# Patient Record
Sex: Female | Born: 1960 | Race: White | Hispanic: No | Marital: Married | Smoking: Never smoker
Health system: Southern US, Community
[De-identification: ages and names within clinical notes are randomized; demographics above are authoritative.]

---

## 2005-08-24 ENCOUNTER — Emergency Department (HOSPITAL_COMMUNITY): Admission: EM | Admit: 2005-08-24 | Discharge: 2005-08-25 | Payer: Self-pay | Admitting: *Deleted

## 2005-08-25 ENCOUNTER — Ambulatory Visit (HOSPITAL_COMMUNITY): Admission: RE | Admit: 2005-08-25 | Discharge: 2005-08-25 | Payer: Self-pay | Admitting: Emergency Medicine

## 2005-08-25 ENCOUNTER — Encounter: Payer: Self-pay | Admitting: Vascular Surgery

## 2010-03-31 ENCOUNTER — Encounter (INDEPENDENT_AMBULATORY_CARE_PROVIDER_SITE_OTHER): Payer: Self-pay | Admitting: *Deleted

## 2010-04-01 ENCOUNTER — Encounter (INDEPENDENT_AMBULATORY_CARE_PROVIDER_SITE_OTHER): Payer: Self-pay | Admitting: *Deleted

## 2010-04-02 ENCOUNTER — Ambulatory Visit: Payer: Self-pay | Admitting: Internal Medicine

## 2010-04-14 ENCOUNTER — Ambulatory Visit: Payer: Self-pay | Admitting: Internal Medicine

## 2010-06-01 NOTE — Letter (Signed)
Summary: [facility] Instructions  [facility]  [address] [place], [place] [address]   Phone: [phone]  Fax: [phone]       ALECIA DOI    July 07, 1960    MRN: [id number]       Procedure Day Dorna Bloom:  Wednesday 04/14/2010     Arrival Time: 8:30 am     Procedure Time: 9:30 am     Location of Procedure:                    _ x_  [facility] (4th Floor)   PREPARATION FOR COLONOSCOPY WITH MIRALAX  Starting 5 days prior to your procedure Thursday 12/9 do not eat nuts, seeds, popcorn, corn, beans, peas,  salads, or any raw vegetables.  Do not take any fiber supplements (e.g. Metamucil, Citrucel, and Benefiber). ____________________________________________________________________________________________________   THE DAY BEFORE YOUR PROCEDURE         DATE: Tuesday 12/13  1   Drink clear liquids the entire day-NO SOLID FOOD  2   Do not drink anything colored red or purple.  Avoid juices with pulp.  No orange juice.  3   Drink at least 64 oz. (8 glasses) of fluid/clear liquids during the day to prevent dehydration and help the prep work efficiently.  CLEAR LIQUIDS INCLUDE: Water Jello Ice Popsicles Tea (sugar ok, no milk/cream) Powdered fruit flavored drinks Coffee (sugar ok, no milk/cream) Gatorade Juice: apple, white grape, white cranberry  Lemonade Clear bullion, consomm, broth Carbonated beverages (any kind) Strained chicken noodle soup Hard Candy  4   Mix the entire bottle of Miralax with 64 oz. of Gatorade/Powerade in the morning and put in the refrigerator to chill.  5   At 3:00 pm take 2 Dulcolax/Bisacodyl tablets.  6   At 4:30 pm take one Reglan/Metoclopramide tablet.  7  Starting at 5:00 pm drink one 8 oz glass of the Miralax mixture every 15-20 minutes until you have finished drinking the entire 64 oz.  You should finish drinking prep around 7:30 or 8:00 pm.  8   If you are nauseated, you may take the 2nd Reglan/Metoclopramide  tablet at 6:30 pm.        9    At 8:00 pm take 2 more DULCOLAX/Bisacodyl tablets.     THE DAY OF YOUR PROCEDURE      DATE:  Wednesday 12/14  You may drink clear liquids until 7:30 am  (2 HOURS BEFORE PROCEDURE).   MEDICATION INSTRUCTIONS  Unless otherwise instructed, you should take regular prescription medications with a small sip of water as early as possible the morning of your procedure.         OTHER INSTRUCTIONS  You will need a responsible adult at least 51 years of age to accompany you and drive you home.   This person must remain in the waiting room during your procedure.  Wear loose fitting clothing that is easily removed.  Leave jewelry and other valuables at home.  However, you may wish to bring a book to read or an iPod/MP3 player to listen to music as you wait for your procedure to start.  Remove all body piercing jewelry and leave at home.  Total time from sign-in until discharge is approximately 2-3 hours.  You should go home directly after your procedure and rest.  You can resume normal activities the day after your procedure.  The day of your procedure you should not:   Drive   Make legal  decisions   Operate machinery   Drink alcohol   Return to work  You will receive specific instructions about eating, activities and medications before you leave.   The above instructions have been reviewed and explained to me by   Karl Bales RN  April 02, 2010 4:07 PM    I fully understand and can verbalize these instructions _____________________________ Date _______

## 2010-06-01 NOTE — Miscellaneous (Signed)
Summary: [facility] previsit  Clinical Lists Changes  Medications: Added new medication of DULCOLAX 5 MG  TBEC (BISACODYL) Day before procedure take 2 at 3pm and 2 at 8pm. - Signed Added new medication of METOCLOPRAMIDE HCL 10 MG  TABS (METOCLOPRAMIDE HCL) As per prep instructions. - Signed Added new medication of MIRALAX   POWD (POLYETHYLENE GLYCOL 3350) As per prep  instructions. - Signed Rx of DULCOLAX 5 MG  TBEC (BISACODYL) Day before procedure take 2 at 3pm and 2 at 8pm.;  #4 x 0;  Signed;  Entered by: Karl Bales RN;  Authorized by: Hart Carwin MD;  Method used: [facility] to [facility] [phone]*, [address] [place], [place], [place]  [address], Ph: [phone], Fax: [phone] Rx of METOCLOPRAMIDE HCL 10 MG  TABS (METOCLOPRAMIDE HCL) As per prep instructions.;  #2 x 0;  Signed;  Entered by: Karl Bales RN;  Authorized by: Hart Carwin MD;  Method used: Electronically to [facility] [phone]*, [address] [place], [place], [place]  [address], Ph: [phone], Fax: [phone] Rx of MIRALAX   POWD (POLYETHYLENE GLYCOL 3350) As per prep  instructions.;  #255gm x 0;  Signed;  Entered by: Karl Bales RN;  Authorized by: Hart Carwin MD;  Method used: Electronically to [facility] [phone]*, [address] [place], [place], [place]  [address], Ph: [phone], Fax: [phone] Allergies: Added new allergy or adverse reaction of CODEINE Added new allergy or adverse reaction of DEMEROL Observations: Added new observation of NKA: F (04/02/2010 15:40)    Prescriptions: MIRALAX   POWD (POLYETHYLENE GLYCOL 3350) As per prep  instructions.  #255gm x 0   Entered by:   Karl Bales RN   Authorized by:   Hart Carwin MD   Signed by:   Karl Bales RN on 04/02/2010   Method used:   Electronically to        [facility] [phone]* (retail)       [address]       [place], [place]   [address]       Ph: [phone]       Fax: [phone]   RxID:   [id number] METOCLOPRAMIDE HCL 10 MG  TABS (METOCLOPRAMIDE HCL) As per prep instructions.  #2 x 0   Entered by:   Karl Bales RN   Authorized by:   Hart Carwin MD   Signed by:   Karl Bales RN on 04/02/2010   Method used:   Electronically to        [facility] [phone]* (retail)       [address]       [place], [place]  [address]       Ph: [phone]       Fax: [phone]   RxID:   [id number] DULCOLAX 5 MG  TBEC (BISACODYL) Day before procedure take 2 at 3pm and 2 at 8pm.  #4 x 0   Entered by:   Karl Bales RN   Authorized by:   Hart Carwin MD   Signed by:   Karl Bales RN on 04/02/2010   Method used:   Electronically to        [facility] [phone]* (retail)       [address] [facility]       [place],  [place]  [address]       Ph: [phone]       Fax: [phone]   RxID:   [id number]

## 2010-06-01 NOTE — Letter (Signed)
Summary: Pre Visit Letter Revised  [facility]  [address] [place], [place] [address]   Phone: [phone]  Fax: [phone]        03/31/2010 MRN: [id number] Regina Leblanc [address] [place], [place]  [address]             Procedure Date:  04-15-11   Welcome to the Gastroenterology Division at [facility].    You are scheduled to see a nurse for your pre-procedure visit on 04-02-10 at 4:00p.m. on the 3rd floor at [facility], [address]  We ask that you try to arrive at our office 15 minutes prior to your appointment time to allow for check-in.  Please take a minute to review the attached form.  If you answer "Yes" to one or more of the questions on the first page, we ask that you call the person listed at your earliest opportunity.  If you answer "No" to all of the questions, please complete the rest of the form and bring it to your appointment.    Your nurse visit will consist of discussing your medical and surgical history, your immediate family medical history, and your medications.   If you are unable to list all of your medications on the form, please bring the medication bottles to your appointment and we will list them.  We will need to be aware of both prescribed and over the counter drugs.  We will need to know exact dosage information as well.    Please be prepared to read and sign documents such as consent forms, a financial agreement, and acknowledgement forms.  If necessary, and with your consent, a friend or relative is welcome to sit-in on the nurse visit with you.  Please bring your insurance card so that we may make a copy of it.  If your insurance requires a referral to see a specialist, please bring your referral form from your primary care physician.  No co-pay is required for this nurse visit.     If you cannot keep your appointment, please call [phone] to cancel or reschedule prior to your appointment date.  This allows  [place] the opportunity to schedule an appointment for another patient in need of care.    Thank you for choosing [facility] for your medical needs.  We appreciate the opportunity to care for you.  Please visit [place] at our website  to learn more about our practice.  Sincerely, The Gastroenterology Division

## 2010-06-03 NOTE — Procedures (Signed)
Summary: Colonoscopy  Patient: Evalette Montrose Note: All result statuses are Final unless otherwise noted.  Tests: (1) Colonoscopy (COL)   COL Colonoscopy           DONE     [facility]     [address]     [place], [place]  [address]           COLONOSCOPY PROCEDURE REPORT           PATIENT:  Lai, Hendriks  MR#:  [id number]     BIRTHDATE:  Aug 25, 1960, 49 yrs. old  GENDER:  female     ENDOSCOPIST:  Hedwig Morton. Juanda Chance, MD     REF. BY:  Annye Rusk, M.D.     PROCEDURE DATE:  04/14/2010     PROCEDURE:  Colonoscopy 44010     ASA CLASS:  Class I     INDICATIONS:  mother with colon cancer     MEDICATIONS:   Versed 10 mg, Fentanyl 100 mcg           DESCRIPTION OF PROCEDURE:   After the risks benefits and     alternatives of the procedure were thoroughly explained, informed     consent was obtained.  Digital rectal exam was performed and     revealed no rectal masses.   The [id number] endoscope was     introduced through the anus and advanced to the cecum, which was     identified by both the appendix and ileocecal valve, without     limitations.  The quality of the prep was good, using MiraLax.     The instrument was then slowly withdrawn as the colon was fully     examined.     <<PROCEDUREIMAGES>>           FINDINGS:  no active bleeding or blood in c (see image1, image2,     image3, image4, image5, and image6).   Retroflexed views in the     rectum revealed no abnormalities.    The scope was then withdrawn     from the patient and the procedure completed.           COMPLICATIONS:  None     ENDOSCOPIC IMPRESSION:     1) No active bleeding or blood in c     RECOMMENDATIONS:     1) high fiber diet     REPEAT EXAM:  In 5 year(s) for.           ______________________________     Hedwig Morton. Juanda Chance, MD           CC:           n.     eSIGNED:   Hedwig Morton. Brodie at 04/14/2010 10:14 AM           Sylvan Cheese, [phone]  Note: An exclamation mark (!) indicates a result  that was not dispersed into the flowsheet. Document Creation Date: 04/14/2010 10:14 AM _______________________________________________________________________  (1) Order result status: Final Collection or observation date-time: 04/14/2010 09:58 Requested date-time:  Receipt date-time:  Reported date-time:  Referring Physician:   Ordering Physician: Lina Sar [id number]) Specimen Source:  Source: Launa Grill Order Number: [id number] Lab site:   Appended Document: Colonoscopy    Clinical Lists Changes  Observations: Added new observation of COLONNXTDUE: 04/2015 (04/14/2010 11:02)

## 2015-05-28 ENCOUNTER — Encounter: Payer: Self-pay | Admitting: Gastroenterology

## 2020-08-13 ENCOUNTER — Encounter (HOSPITAL_BASED_OUTPATIENT_CLINIC_OR_DEPARTMENT_OTHER): Payer: Self-pay | Admitting: Emergency Medicine

## 2020-08-13 ENCOUNTER — Other Ambulatory Visit: Payer: Self-pay

## 2020-08-13 ENCOUNTER — Emergency Department (HOSPITAL_BASED_OUTPATIENT_CLINIC_OR_DEPARTMENT_OTHER): Payer: 59

## 2020-08-13 ENCOUNTER — Emergency Department (HOSPITAL_BASED_OUTPATIENT_CLINIC_OR_DEPARTMENT_OTHER)
Admission: EM | Admit: 2020-08-13 | Discharge: 2020-08-13 | Disposition: A | Discharge status: Home / Self Care | Payer: 59 | Attending: Emergency Medicine | Admitting: Emergency Medicine

## 2020-08-13 DIAGNOSIS — W208XXA Other cause of strike by thrown, projected or falling object, initial encounter: Secondary | ICD-10-CM | POA: Diagnosis not present

## 2020-08-13 DIAGNOSIS — S060X0A Concussion without loss of consciousness, initial encounter: Secondary | ICD-10-CM | POA: Insufficient documentation

## 2020-08-13 DIAGNOSIS — Y9389 Activity, other specified: Secondary | ICD-10-CM | POA: Diagnosis not present

## 2020-08-13 DIAGNOSIS — S0083XA Contusion of other part of head, initial encounter: Secondary | ICD-10-CM | POA: Diagnosis not present

## 2020-08-13 DIAGNOSIS — S0990XA Unspecified injury of head, initial encounter: Secondary | ICD-10-CM | POA: Diagnosis present

## 2020-08-13 NOTE — ED Provider Notes (Signed)
[facility] EMERGENCY DEPT Provider Note   CSN: [id number] Arrival date & time: 08/13/20  1532     History Chief Complaint  Patient presents with  . Head Injury    Regina Leblanc is a 60 y.o. female.  60 yo F with a chief complaints of a cable box that hit her in the head.  She was on the ground trying to plug-in something in the box fell off of a table that is about waist high and fell and struck her in the back of the head.  She has had a mild headache and a bump to the head.  She has had increased sleepiness today and has had some unsteadiness to her gait and so decided to come in for evaluation.  Denies one-sided numbness or weakness denies vomiting denies confusion.  The history is provided by the patient.  Head Injury Location:  Occipital Time since incident:  1 day Mechanism of injury: direct blow   Pain details:    Quality:  Aching   Severity:  Mild   Duration:  1 day   Timing:  Constant   Progression:  Worsening Chronicity:  New Relieved by:  Nothing Worsened by:  Nothing Ineffective treatments:  None tried Associated symptoms: headache   Associated symptoms: no nausea and no vomiting        History reviewed. No pertinent past medical history.  There are no problems to display for this patient.   History reviewed. No pertinent surgical history.   OB History   No obstetric history on file.     No family history on file.  Social History   Tobacco Use  . Smoking status: Never Smoker  . Smokeless tobacco: Never Used    Home Medications Prior to Admission medications   Not on File    Allergies    Codeine and Meperidine hcl  Review of Systems   Review of Systems  Constitutional: Negative for chills and fever.  HENT: Negative for congestion and rhinorrhea.   Eyes: Negative for redness and visual disturbance.  Respiratory: Negative for shortness of breath and wheezing.   Cardiovascular: Negative for chest pain and palpitations.   Gastrointestinal: Negative for nausea and vomiting.  Genitourinary: Negative for dysuria and urgency.  Musculoskeletal: Negative for arthralgias and myalgias.  Skin: Negative for pallor and wound.  Neurological: Positive for dizziness and headaches.    Physical Exam Updated Vital Signs BP (!) 148/81 (BP Location: Left Arm)   Pulse 87   Temp 98.8 F (37.1 C) (Oral)   Resp 16   Ht 5\' 3"  (1.6 m)   Wt 67.1 kg   SpO2 98%   BMI 26.22 kg/m   Physical Exam Vitals and nursing note reviewed.  Constitutional:      General: She is not in acute distress.    Appearance: She is well-developed. She is not diaphoretic.  HENT:     Head: Normocephalic.     Comments: Hematoma to the right side of the crown of her head.  No break to the scalp. Eyes:     Pupils: Pupils are equal, round, and reactive to light.  Cardiovascular:     Rate and Rhythm: Normal rate and regular rhythm.     Heart sounds: No murmur heard. No friction rub. No gallop.   Pulmonary:     Effort: Pulmonary effort is normal.     Breath sounds: No wheezing or rales.  Abdominal:     General: There is no distension.  Palpations: Abdomen is soft.     Tenderness: There is no abdominal tenderness.  Musculoskeletal:        General: No tenderness.     Cervical back: Normal range of motion and neck supple.  Skin:    General: Skin is warm and dry.  Neurological:     Mental Status: She is alert and oriented to person, place, and time.  Psychiatric:        Behavior: Behavior normal.     ED Results / Procedures / Treatments   Labs (all labs ordered are listed, but only abnormal results are displayed) Labs Reviewed - No data to display  EKG None  Radiology CT Head Wo Contrast  Result Date: 08/13/2020 CLINICAL DATA:  Abnormal mental status.  Status post fall EXAM: CT HEAD WITHOUT CONTRAST TECHNIQUE: Contiguous axial images were obtained from the base of the skull through the vertex without intravenous contrast.  COMPARISON:  None. FINDINGS: Brain: No evidence of large-territorial acute infarction. No parenchymal hemorrhage. No mass lesion. No extra-axial collection. No mass effect or midline shift. No hydrocephalus. Basilar cisterns are patent. Vascular: No hyperdense vessel. Skull: No acute fracture or focal lesion. Sinuses/Orbits: Mucosal thickening of bilateral sphenoid and ethmoid sinuses. Paranasal sinuses and mastoid air cells are clear. The orbits are unremarkable. Other: None. IMPRESSION: No acute intracranial abnormality. Electronically Signed   By: Tish Frederickson M.D.   On: 08/13/2020 16:48    Procedures Procedures   Medications Ordered in ED Medications - No data to display  ED Course  I have reviewed the triage vital signs and the nursing notes.  Pertinent labs & imaging results that were available during my care of the patient were reviewed by me and considered in my medical decision making (see chart for details).    MDM Rules/Calculators/A&P                          60 yo F with a chief complaints of a direct blow to her head by a cable TV box.  Seems like a low mechanism patient not on blood thinners she is however complaining of unsteadiness when she ambulates and has had increased sleepiness today.  Will obtain a CT scan of the head. CT the head is negative.  Discharge home.  4:53 PM:  I have discussed the diagnosis/risks/treatment options with the patient and believe the pt to be eligible for discharge home to follow-up with PCP. We also discussed returning to the ED immediately if new or worsening sx occur. We discussed the sx which are most concerning (e.g., sudden worsening pain, fever, inability to tolerate by mouth) that necessitate immediate return. Medications administered to the patient during their visit and any new prescriptions provided to the patient are listed below.  Medications given during this visit Medications - No data to display   The patient appears  reasonably screen and/or stabilized for discharge and I doubt any other medical condition or other [employer] requiring further screening, evaluation, or treatment in the ED at this time prior to discharge.    Final Clinical Impression(s) / ED Diagnoses Final diagnoses:  Concussion without loss of consciousness, initial encounter    Rx / DC Orders ED Discharge Orders    None       Melene Plan, DO 08/13/20 1653

## 2020-08-13 NOTE — ED Triage Notes (Signed)
She was switching the cable box to a new TV last night when it fell on the top of your head. C/o feeling "out of sorts" and a headache. Denies LOC.

## 2020-08-13 NOTE — Discharge Instructions (Signed)
Most likely you have a concussion.  You are having symptoms after a closed head injury.  Your CT scan of your head did not show any bleeding inside the skull.  Please follow-up with the family doctor.  Typically things that you your brain will make concussion symptoms worse i.e. reading using your smart phone or performing mathematics such as balancing the budget.  If you realize that something you are doing is making your symptoms worse try to stop it and do something that does not.  If this is a ongoing issue sometimes you will need to see a specialist but typically your symptoms will resolve over the course of a week.

## 2021-09-26 IMAGING — CT CT HEAD W/O CM
4 series · 17 of 47 positions shown, 19 images · non-contrast
Comparison: None.

CLINICAL DATA: Abnormal mental status.  Status post fall

EXAM:
CT HEAD WITHOUT CONTRAST
TECHNIQUE: Contiguous axial images were obtained from the base of the skull
through the vertex without intravenous contrast.

[Series 2: head wo · axial · 0.42mm/px · z∈[-179,-59]mm · 7 of 33 slices shown, 9 images]
[im 5/33  brain]
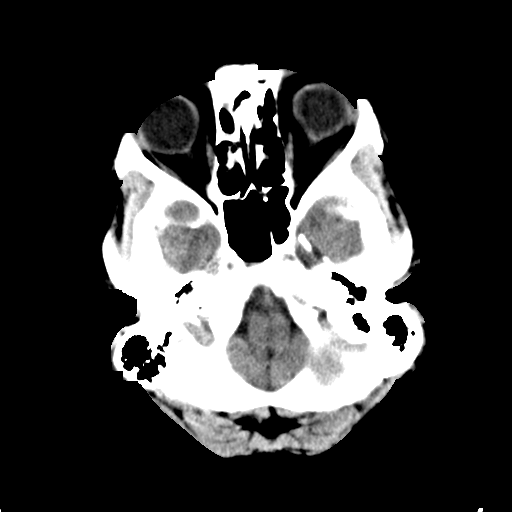
[im 5/33  bone]
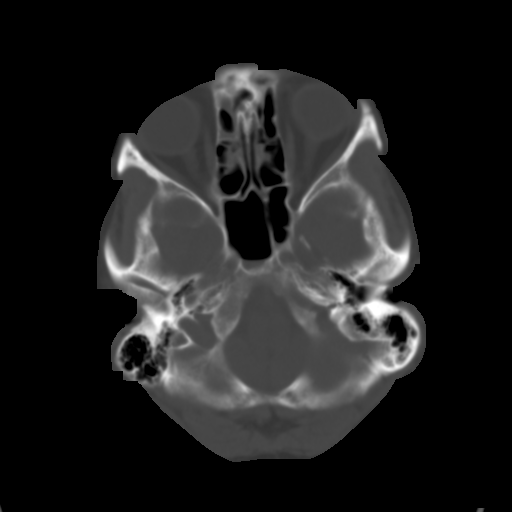
[im 9/33  brain]
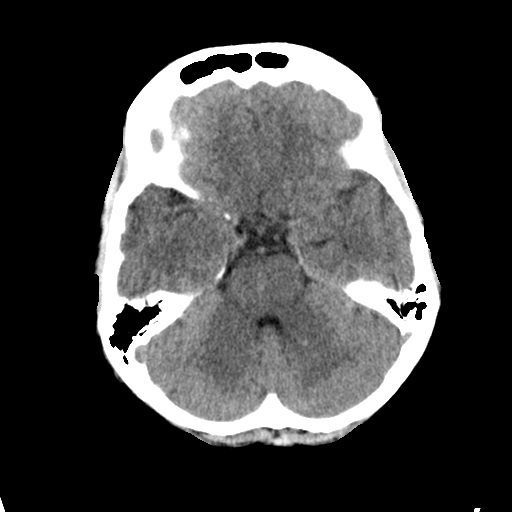
[im 13/33  brain]
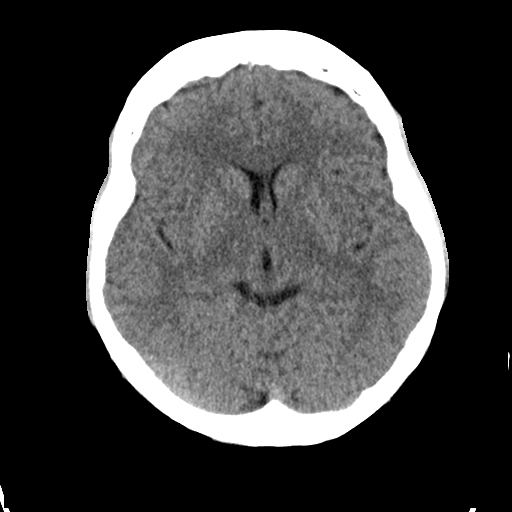
[im 17/33  brain]
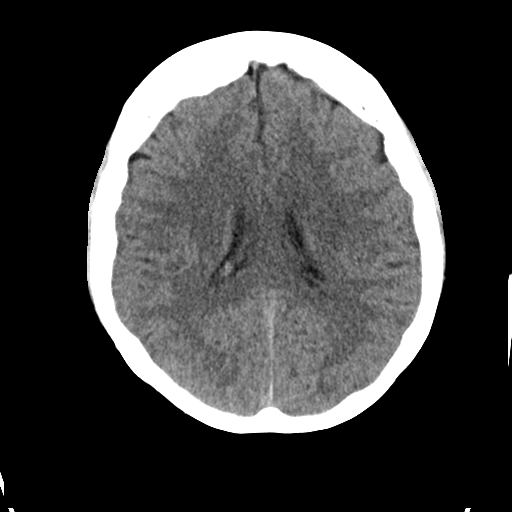
[im 21/33  brain]
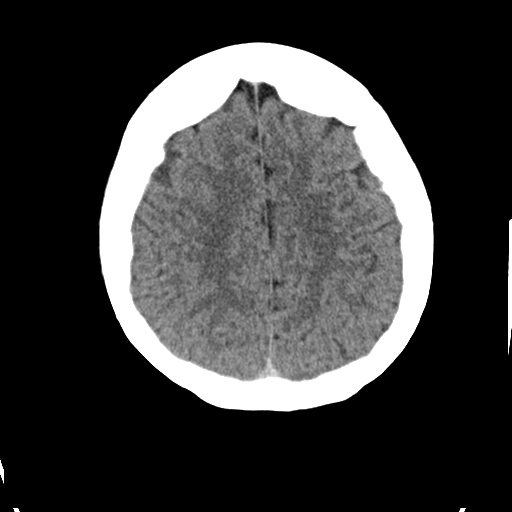
[im 21/33  bone]
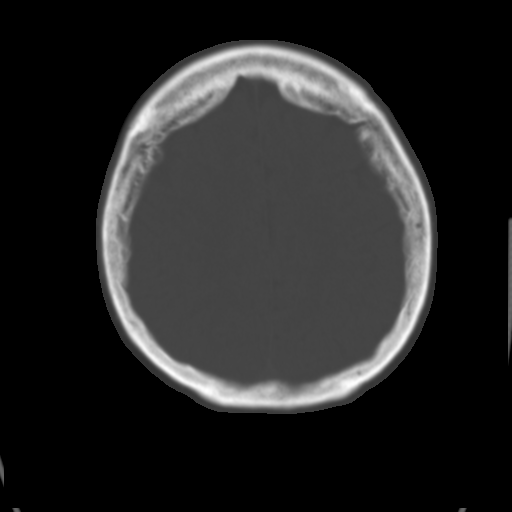
[im 25/33  brain]
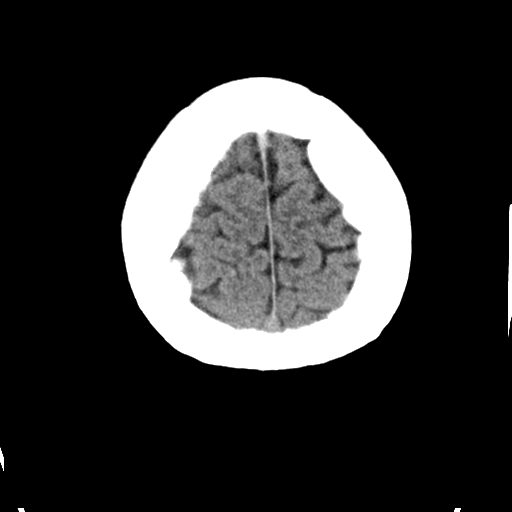
[im 29/33  brain]
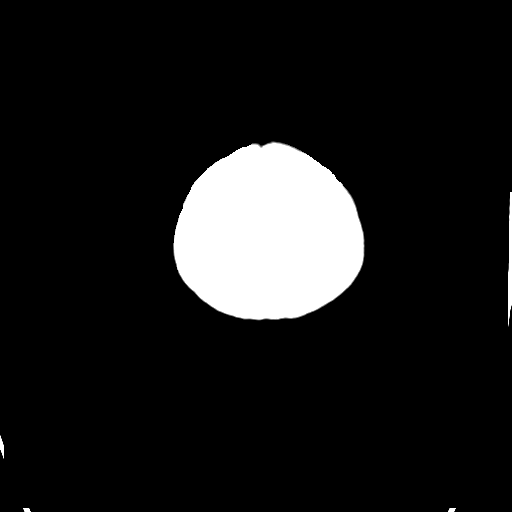

[Series 3: head bone · axial · 0.42mm/px · z∈[-183,-127]mm · 4 of 82 slices shown]
[im 9/82  bone]
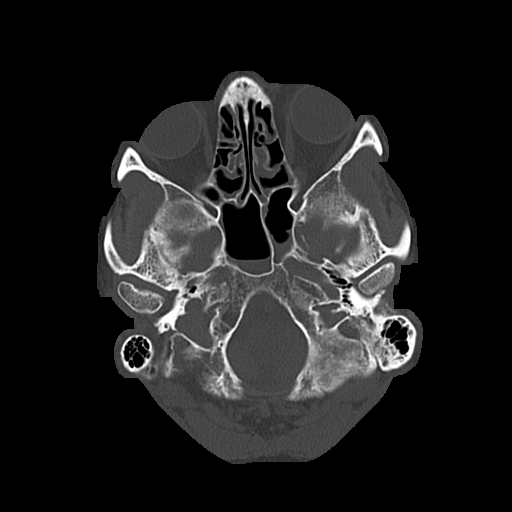
[im 17/82  bone]
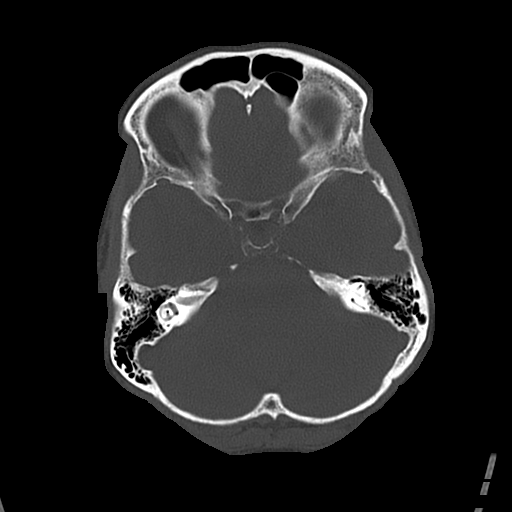
[im 25/82  bone]
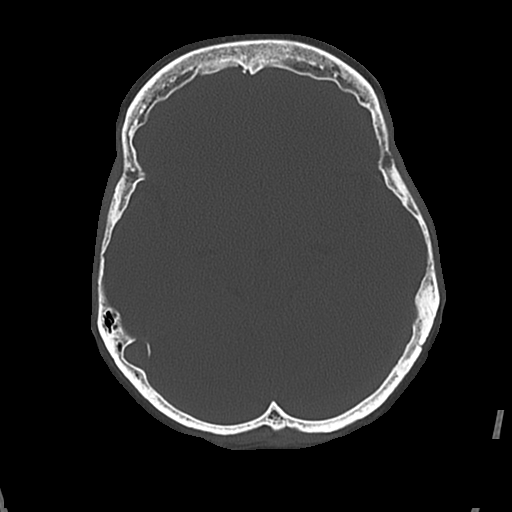
[im 37/82  bone]
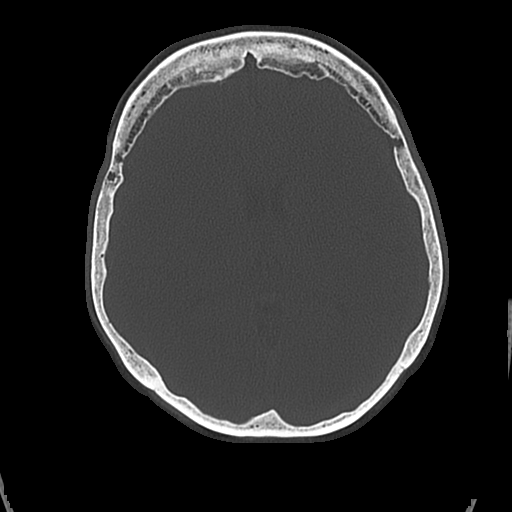

[Series 4: coronal soft · coronal · 0.32mm/px · 3 of 68 slices shown]
[im 23/68  brain]
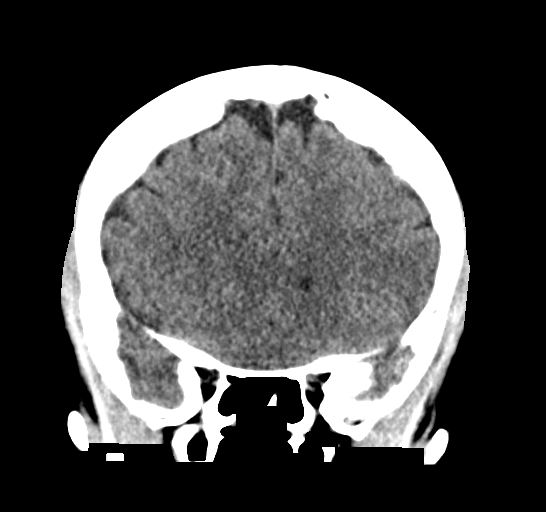
[im 30/68  brain]
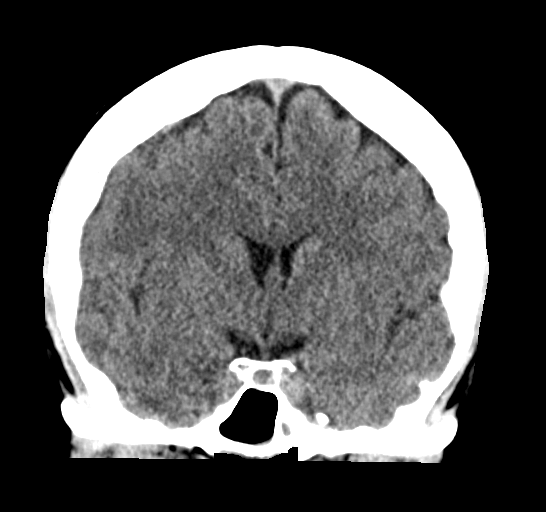
[im 38/68  brain]
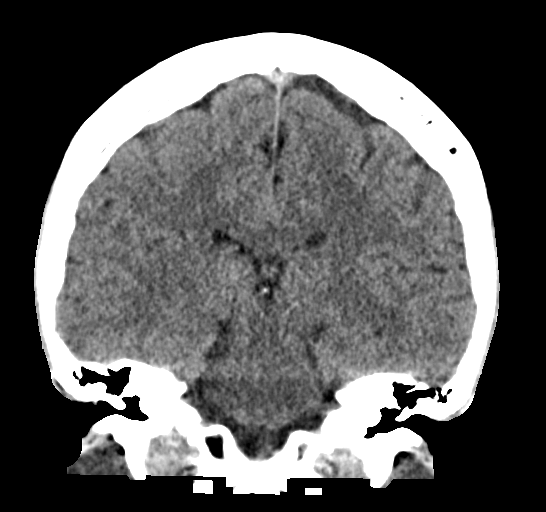

[Series 5: sagittal soft · sagittal · 0.32mm/px · 3 of 57 slices shown]
[im 19/57  brain]
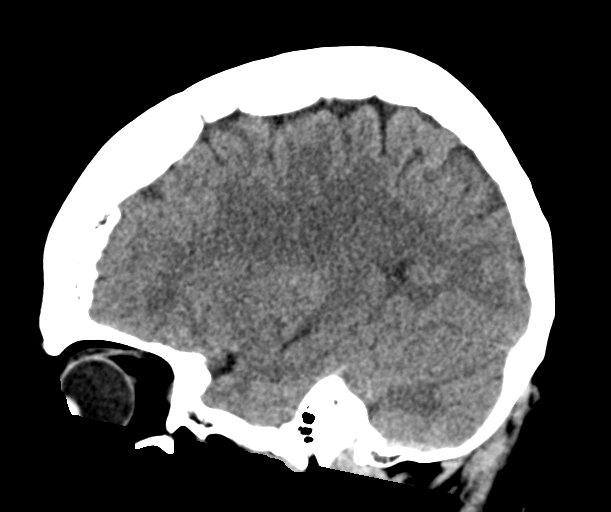
[im 29/57  brain]
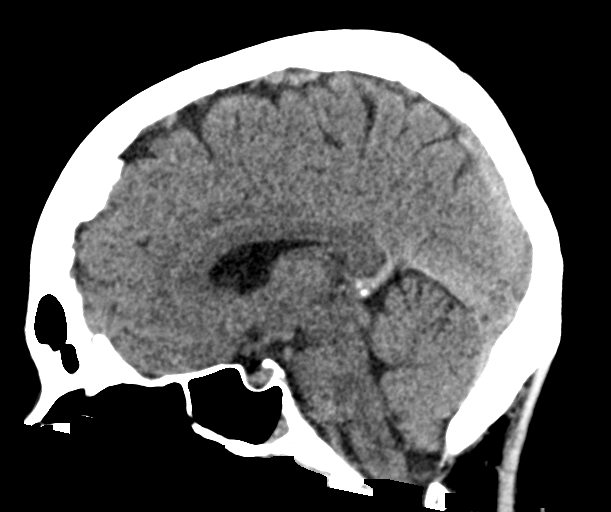
[im 38/57  brain]
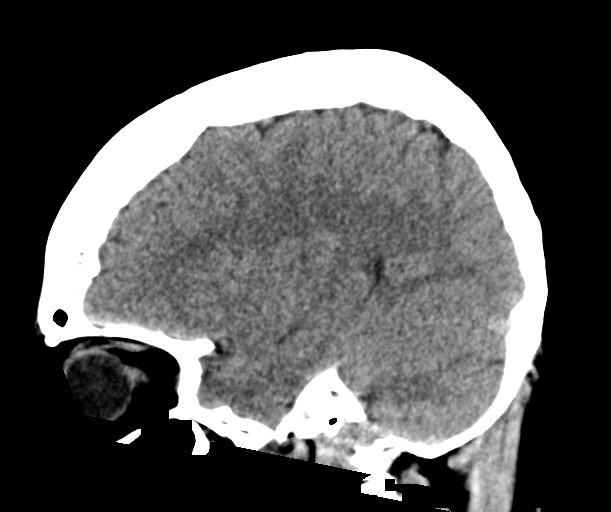

[17 of 47 positions shown; findings below may reference images not displayed]

FINDINGS: Brain:

No evidence of large-territorial acute infarction. No parenchymal
hemorrhage. No mass lesion. No extra-axial collection.

No mass effect or midline shift. No hydrocephalus. Basilar cisterns
are patent.

Vascular: No hyperdense vessel.

Skull: No acute fracture or focal lesion.

Sinuses/Orbits: Mucosal thickening of bilateral sphenoid and ethmoid
sinuses. Paranasal sinuses and mastoid air cells are clear. The
orbits are unremarkable.

Other: None.
IMPRESSION: No acute intracranial abnormality.
